# Patient Record
Sex: Female | Born: 2005 | Race: White | Hispanic: No | Marital: Single
Health system: Southern US, Community
[De-identification: ages and names within clinical notes are randomized; demographics above are authoritative.]

---
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
## 2023-01-18 DIAGNOSIS — F320 Major depressive disorder, single episode, mild: Secondary | ICD-10-CM | POA: Diagnosis not present

## 2023-01-18 DIAGNOSIS — F411 Generalized anxiety disorder: Secondary | ICD-10-CM | POA: Diagnosis not present

## 2023-01-21 DIAGNOSIS — J069 Acute upper respiratory infection, unspecified: Secondary | ICD-10-CM | POA: Diagnosis not present

## 2023-03-19 DIAGNOSIS — F419 Anxiety disorder, unspecified: Secondary | ICD-10-CM | POA: Diagnosis not present

## 2023-04-09 DIAGNOSIS — F419 Anxiety disorder, unspecified: Secondary | ICD-10-CM | POA: Diagnosis not present

## 2023-04-17 DIAGNOSIS — F411 Generalized anxiety disorder: Secondary | ICD-10-CM | POA: Diagnosis not present

## 2023-04-17 DIAGNOSIS — F320 Major depressive disorder, single episode, mild: Secondary | ICD-10-CM | POA: Diagnosis not present

## 2023-05-02 DIAGNOSIS — F419 Anxiety disorder, unspecified: Secondary | ICD-10-CM | POA: Diagnosis not present
